# Patient Record
Sex: Male | Born: 1960 | Race: White | Hispanic: No | Marital: Married | Smoking: Current every day smoker
Health system: Southern US, Community
[De-identification: ages and names within clinical notes are randomized; demographics above are authoritative.]

## PROBLEM LIST (undated history)

## (undated) DIAGNOSIS — R0600 Dyspnea, unspecified: Secondary | ICD-10-CM

## (undated) DIAGNOSIS — E785 Hyperlipidemia, unspecified: Secondary | ICD-10-CM

## (undated) DIAGNOSIS — C61 Malignant neoplasm of prostate: Secondary | ICD-10-CM

## (undated) DIAGNOSIS — J410 Simple chronic bronchitis: Secondary | ICD-10-CM

## (undated) DIAGNOSIS — K219 Gastro-esophageal reflux disease without esophagitis: Secondary | ICD-10-CM

## (undated) DIAGNOSIS — I10 Essential (primary) hypertension: Secondary | ICD-10-CM

## (undated) DIAGNOSIS — R0609 Other forms of dyspnea: Secondary | ICD-10-CM

## (undated) DIAGNOSIS — K08109 Complete loss of teeth, unspecified cause, unspecified class: Secondary | ICD-10-CM

## (undated) DIAGNOSIS — Z973 Presence of spectacles and contact lenses: Secondary | ICD-10-CM

## (undated) DIAGNOSIS — N529 Male erectile dysfunction, unspecified: Secondary | ICD-10-CM

## (undated) DIAGNOSIS — Z972 Presence of dental prosthetic device (complete) (partial): Secondary | ICD-10-CM

## (undated) DIAGNOSIS — Z87828 Personal history of other (healed) physical injury and trauma: Secondary | ICD-10-CM

## (undated) HISTORY — PX: PROSTATE BIOPSY: SHX241

## (undated) HISTORY — PX: COLONOSCOPY: SHX174

## (undated) HISTORY — PX: TONSILLECTOMY: SUR1361

## (undated) HISTORY — PX: OTHER SURGICAL HISTORY: SHX169

---

## 2016-02-13 DIAGNOSIS — N401 Enlarged prostate with lower urinary tract symptoms: Secondary | ICD-10-CM | POA: Diagnosis not present

## 2016-02-13 DIAGNOSIS — R972 Elevated prostate specific antigen [PSA]: Secondary | ICD-10-CM | POA: Diagnosis not present

## 2016-03-18 DIAGNOSIS — R972 Elevated prostate specific antigen [PSA]: Secondary | ICD-10-CM | POA: Diagnosis not present

## 2016-03-18 DIAGNOSIS — N401 Enlarged prostate with lower urinary tract symptoms: Secondary | ICD-10-CM | POA: Diagnosis not present

## 2016-04-01 DIAGNOSIS — R972 Elevated prostate specific antigen [PSA]: Secondary | ICD-10-CM | POA: Diagnosis not present

## 2016-04-01 DIAGNOSIS — C61 Malignant neoplasm of prostate: Secondary | ICD-10-CM | POA: Diagnosis not present

## 2016-04-01 DIAGNOSIS — N410 Acute prostatitis: Secondary | ICD-10-CM | POA: Diagnosis not present

## 2016-04-01 DIAGNOSIS — N401 Enlarged prostate with lower urinary tract symptoms: Secondary | ICD-10-CM | POA: Diagnosis not present

## 2016-04-08 DIAGNOSIS — C61 Malignant neoplasm of prostate: Secondary | ICD-10-CM | POA: Diagnosis not present

## 2016-04-08 DIAGNOSIS — N390 Urinary tract infection, site not specified: Secondary | ICD-10-CM | POA: Diagnosis not present

## 2016-04-13 DIAGNOSIS — C61 Malignant neoplasm of prostate: Secondary | ICD-10-CM | POA: Diagnosis not present

## 2016-04-24 DIAGNOSIS — C61 Malignant neoplasm of prostate: Secondary | ICD-10-CM | POA: Diagnosis not present

## 2016-04-28 ENCOUNTER — Encounter: Payer: Self-pay | Admitting: Radiation Oncology

## 2016-05-18 ENCOUNTER — Ambulatory Visit
Admission: RE | Admit: 2016-05-18 | Discharge: 2016-05-18 | Disposition: A | Discharge status: Home / Self Care | Payer: 59 | Source: Ambulatory Visit | Attending: Radiation Oncology | Admitting: Radiation Oncology

## 2016-05-18 ENCOUNTER — Encounter: Payer: Self-pay | Admitting: Medical Oncology

## 2016-05-18 ENCOUNTER — Encounter: Payer: Self-pay | Admitting: Radiation Oncology

## 2016-05-18 VITALS — BP 142/95 | HR 117 | Temp 98.1°F | Resp 20 | Wt 227.2 lb

## 2016-05-18 DIAGNOSIS — Z79899 Other long term (current) drug therapy: Secondary | ICD-10-CM | POA: Diagnosis not present

## 2016-05-18 DIAGNOSIS — C61 Malignant neoplasm of prostate: Secondary | ICD-10-CM | POA: Insufficient documentation

## 2016-05-18 DIAGNOSIS — R972 Elevated prostate specific antigen [PSA]: Secondary | ICD-10-CM | POA: Diagnosis not present

## 2016-05-18 HISTORY — DX: Gastro-esophageal reflux disease without esophagitis: K21.9

## 2016-05-18 HISTORY — DX: Essential (primary) hypertension: I10

## 2016-05-18 HISTORY — DX: Male erectile dysfunction, unspecified: N52.9

## 2016-05-18 HISTORY — DX: Malignant neoplasm of prostate: C61

## 2016-05-18 NOTE — Progress Notes (Signed)
GU Location of Tumor / Histology: prostatic adenocarcinoma  If Prostate Cancer, Gleason Score is (3 + 4) and PSA is (5.8). Prostate volume: 37.6 mm X 46.6 mm X 31.1 mm  Dean Burton presented to Dr. Nila Nephew 02/13/2016 with an elevated PSA.  Biopsies of prostate (if applicable) revealed:     Past/Anticipated interventions by urology, if any: biopsy, referral to Dr. Tammi Klippel to discuss seeds  Past/Anticipated interventions by medical oncology, if any: no  Weight changes, if any: no  Bowel/Bladder complaints, if any:  IPSS 0. Denies dysuria, hematuria, or leakage. Reports taking flomax and finasteride. Finasteride was recently prescribed by Dr. Nila Nephew and patient believes its causing his nausea.  Nausea/Vomiting, if any: no  Pain issues, if any: no  SAFETY ISSUES:  Prior radiation? no  Pacemaker/ICD? no  Possible current pregnancy? no  Is the patient on methotrexate? no  Current Complaints / other details:  56 year old male. Interested in seed implant.

## 2016-05-18 NOTE — Progress Notes (Signed)
See progress note under physician encounter. 

## 2016-05-18 NOTE — Progress Notes (Signed)
I met Mr. Ouzts and his wife today during his consult with Dr. Tammi Klippel.I introduced myself, discussed my role and gave them my business card.  He has done research and would like to have brachytherapy as treatment. I will continue to follow and asked them to call me with questions or concerns. They voiced understanding.

## 2016-05-18 NOTE — Progress Notes (Signed)
Radiation Oncology         [phone] ________________________________  Initial outpatient Consultation  Name: Konrad Hoak MRN: [id number]  Date: 05/18/2016  DOB: [birth year]  BH:ALPFXT,KWIO, MD  Joie Bimler, MD   REFERRING PHYSICIAN: Joie Bimler, MD  DIAGNOSIS: 56 year-old gentleman with Stage T1c adenocarcinoma of the prostate with Gleason Score of 3+4, and PSA of 5.8.    ICD-9-CM ICD-10-CM   1. Malignant neoplasm of prostate ([place]) 185 C61     HISTORY OF PRESENT ILLNESS: Gladstone Rosas is a 56 y.o. male with a diagnosis of prostate cancer. He was noted to have an elevated PSA of 4.8 in November 2017 by his primary care physician.  Repeat PSA on 03/18/16 is 5.8. Accordingly, he was referred for evaluation in urology by Dr. Nila Nephew on 02/13/16,  digital rectal examination was performed at that time revealing no palpable nodularity. The patient proceeded to transrectal ultrasound with 12 biopsies of the prostate on 04/01/2016.  The prostate volume measured 37.32mm x 46.74mm x  31.32mm.  Out of 12 core biopsies, 6 were positive.  The maximum Gleason score was 3+4, and this was seen in the RA, LLB, and LB. Additionally Gleason score 3+3 was seen in the RLM, LLM, and LM.      The patient reviewed the biopsy results with his urologist and he has kindly been referred today for discussion of potential radiation treatment options.   PREVIOUS RADIATION THERAPY: No  PAST MEDICAL HISTORY:  Past Medical History:  Diagnosis Date  . Elevated PSA   . Erectile dysfunction   . GERD (gastroesophageal reflux disease)   . Hypertension   . Prostate cancer ([place])       PAST SURGICAL HISTORY: Past Surgical History:  Procedure Laterality Date  . COLONOSCOPY    . COLONOSCOPY WITH ESOPHAGOGASTRODUODENOSCOPY (EGD)    . PROSTATE BIOPSY      FAMILY HISTORY:  Family History  Problem Relation Age of Onset  . Cancer Neg Hx     SOCIAL HISTORY:  Social History   Social History  . Marital status:  Married    Spouse name: N/A  . Number of children: N/A  . Years of education: N/A   Occupational History  . Not on file.   Social History Main Topics  . Smoking status: Current Every Day Smoker    Packs/day: 1.00    Years: 35.00  . Smokeless tobacco: Never Used  . Alcohol use Yes     Comment: occasional  . Drug use: No  . Sexual activity: Yes   Other Topics Concern  . Not on file   Social History Narrative  . No narrative on file    ALLERGIES: Ciprofloxacin  MEDICATIONS:  Current Outpatient Prescriptions  Medication Sig Dispense Refill  . amLODipine (NORVASC) 2.5 MG tablet Take 2.5 mg by mouth daily.  1  . BEPREVE 1.5 % SOLN INSTILL 1 DROP 1-2 TIMES DAILY AS NEEDED FOR ITCHY WATERY EYES  1  . citalopram (CELEXA) 20 MG tablet Take 20 mg by mouth daily.  1  . finasteride (PROSCAR) 5 MG tablet Take 5 mg by mouth daily.  3  . fluticasone (FLONASE) 50 MCG/ACT nasal spray USE 2 SPRAYS PER NOSTRIL DAILY  3  . losartan-hydrochlorothiazide (HYZAAR) 100-25 MG tablet 1 (ONE) TABLET BY MOUTH DAILY  3  . omeprazole (PRILOSEC) 40 MG capsule Take 40 mg by mouth daily.  10  . rosuvastatin (CRESTOR) 5 MG tablet Take 5 mg by mouth at bedtime.  2  .  tamsulosin (FLOMAX) 0.4 MG CAPS capsule TAKE ONE CAPSULE BY MOUTH EVERY DAY 1/2 HOUR AFTER SUPPER  3   No current facility-administered medications for this encounter.     REVIEW OF SYSTEMS:  On review of systems, the patient reports that he is doing well overall. He denies any chest pain, shortness of breath, cough, fevers, chills, night sweats, unintended weight changes. He denies any bowel disturbances, and denies abdominal pain. He denies any new musculoskeletal or joint aches or pains. His IPSS was 0, indicating no urinary symptoms. He denies dysuria, hematuria, or leakage. He reports taking Flomax and finasteride. Finasteride was recently prescribed by Dr. Nila Nephew and patient believes it's causing his nausea and vomiting. He is able to complete  sexual activity with about half the time attempts. A complete review of systems is obtained and is otherwise negative.    PHYSICAL EXAM:  Wt Readings from Last 3 Encounters:  05/18/16 227 lb 3.2 oz (103.1 kg)   Temp Readings from Last 3 Encounters:  05/18/16 98.1 F (36.7 C) (Oral)   BP Readings from Last 3 Encounters:  05/18/16 (!) 142/95   Pulse Readings from Last 3 Encounters:  05/18/16 (!) 117   Pain Assessment Pain Score: 0-No pain/10  In general this is a well appearing caucasian male in no acute distress. He is alert and oriented x4 and appropriate throughout the examination. HEENT reveals that the patient is normocephalic, atraumatic. EOMs are intact. PERRLA. Skin is intact without any evidence of gross lesions. Cardiovascular exam reveals a regular rate and rhythm, no clicks rubs or murmurs are auscultated. Chest is clear to auscultation bilaterally. Lymphatic assessment is performed and does not reveal any adenopathy in the cervical, supraclavicular, axillary, or inguinal chains. Abdomen has active bowel sounds in all quadrants and is intact. The abdomen is soft, non tender, non distended. Lower extremities are negative for pretibial pitting edema, deep calf tenderness, cyanosis or clubbing.   KPS = 100  100 - Normal; no complaints; no evidence of disease. 90   - Able to carry on normal activity; minor signs or symptoms of disease. 80   - Normal activity with effort; some signs or symptoms of disease. 51   - Cares for self; unable to carry on normal activity or to do active work. 60   - Requires occasional assistance, but is able to care for most of his personal needs. 50   - Requires considerable assistance and frequent medical care. 51   - Disabled; requires special care and assistance. 65   - Severely disabled; hospital admission is indicated although death not imminent. 76   - Very sick; hospital admission necessary; active supportive treatment necessary. 10   -  Moribund; fatal processes progressing rapidly. 0     - Dead  Karnofsky DA, Abelmann WH, Craver LS and Burchenal JH [phone]) The use of the nitrogen mustards in the palliative treatment of carcinoma: with particular reference to bronchogenic carcinoma Cancer 1 [phone]  LABORATORY DATA:  No results found for: WBC, HGB, HCT, MCV, PLT No results found for: NA, K, CL, CO2 No results found for: ALT, AST, GGT, ALKPHOS, BILITOT   RADIOGRAPHY: No results found.    IMPRESSION/PLAN: 1. 56 y.o. gentleman with Stage T1c adenocarcinoma of the prostate with Gleason Score of 3+4, and PSA of 5.8.  Today I reviewed the findings and workup thus far.  We discussed the natural history of prostate cancer.  We reviewed the the implications of T-stage, Gleason's Score, and PSA on  decision-making and outcomes in prostate cancer.  We discussed radiation treatment in the management of prostate cancer with regard to the logistics and delivery of external beam radiation treatment as well as the logistics and delivery of prostate brachytherapy.  We compared and contrasted each of these approaches and also compared these against prostatectomy.  The patient expressed interest in prostate brachytherapy.  I filled out a patient counseling form for him with relevant treatment diagrams and we retained a copy for our records.   The patient would like to proceed with prostate brachytherapy.  I will share my findings with Dr. Nila Nephew and move forward with scheduling the procedure in the near future.     The patient would like to proceed with prostate brachytherapy.  I will share my findings with Dr. Nila Nephew and move forward with scheduling the procedure in the near future.     I recommended the patient discontinue taking finasteride to see if nausea and vomiting resolve. The patient will contact Dr. Ara Kussmaul office and continue to follow with urology regarding urinary symptoms and medications.   I enjoyed meeting with him today, and will look  forward to participating in the care of this very nice gentleman.   ------------------------------------------------   Tyler Pita, MD [place] Director and Director of Stereotactic Radiosurgery Direct Dial: [phone]  Fax: [phone] [email or web address]  Skype  [email or web address]  This document serves as a record of services personally performed by Tyler Pita, MD. It was created on his behalf by Arlyce Harman, a trained medical scribe. The creation of this record is based on the scribe's personal observations and the provider's statements to them. This document has been checked and approved by the attending provider.

## 2016-05-22 ENCOUNTER — Telehealth: Payer: Self-pay | Admitting: *Deleted

## 2016-05-22 NOTE — Telephone Encounter (Signed)
CALLED PATIENT TO INFORM OF PRE-SEED PLANNING CT ON 05-29-16 @ 3 PM, SPOKE WITH PATIENT'S WIFE - MARY AND SHE IS AWARE OF THIS APPT.

## 2016-05-26 ENCOUNTER — Telehealth: Payer: Self-pay | Admitting: *Deleted

## 2016-05-26 NOTE — Telephone Encounter (Signed)
Called patient to inform of pre-seed appt. On 05-29-16 and his implant on 08-20-16, spoke with patient and he is aware of these appts.

## 2016-05-28 ENCOUNTER — Telehealth: Payer: Self-pay | Admitting: *Deleted

## 2016-05-28 NOTE — Telephone Encounter (Signed)
CALLED PATIENT TO REMIND OF PRE-SEED APPTS. FOR 05-29-16, SPOKE WITH PATIENT AND HE IS AWARE OF  THESE APPTS.

## 2016-05-29 ENCOUNTER — Inpatient Hospital Stay
Admission: RE | Admit: 2016-05-29 | Discharge: 2016-05-29 | Disposition: A | Discharge status: Home / Self Care | Payer: 59 | Source: Ambulatory Visit | Attending: Radiation Oncology | Admitting: Radiation Oncology

## 2016-05-29 ENCOUNTER — Ambulatory Visit (HOSPITAL_COMMUNITY)
Admission: RE | Admit: 2016-05-29 | Discharge: 2016-05-29 | Disposition: A | Discharge status: Home / Self Care | Payer: 59 | Source: Ambulatory Visit | Attending: Urology | Admitting: Urology

## 2016-05-29 ENCOUNTER — Other Ambulatory Visit: Payer: Self-pay

## 2016-05-29 ENCOUNTER — Ambulatory Visit: Payer: 59 | Admitting: Radiation Oncology

## 2016-05-29 ENCOUNTER — Ambulatory Visit
Admission: RE | Admit: 2016-05-29 | Discharge: 2016-05-29 | Disposition: A | Discharge status: Home / Self Care | Payer: 59 | Source: Ambulatory Visit | Attending: Radiation Oncology | Admitting: Radiation Oncology

## 2016-05-29 DIAGNOSIS — C61 Malignant neoplasm of prostate: Secondary | ICD-10-CM

## 2016-05-29 DIAGNOSIS — Z01810 Encounter for preprocedural cardiovascular examination: Secondary | ICD-10-CM | POA: Diagnosis not present

## 2016-05-29 DIAGNOSIS — J984 Other disorders of lung: Secondary | ICD-10-CM | POA: Insufficient documentation

## 2016-05-29 DIAGNOSIS — R918 Other nonspecific abnormal finding of lung field: Secondary | ICD-10-CM | POA: Diagnosis not present

## 2016-05-29 NOTE — Progress Notes (Signed)
  Radiation Oncology         [phone]) [phone] ________________________________  Name: Dean Burton MRN: [id number]  Date: 05/29/2016  DOB: 1961/01/17  SIMULATION AND TREATMENT PLANNING NOTE PUBIC ARCH STUDY  FP:OIPPGF,QMKJ, MD  Joie Bimler, MD  DIAGNOSIS: 56 year-old gentleman with Stage T1c adenocarcinoma of the prostate with Gleason Score of 3+4, and PSA of 5.8.     ICD-9-CM ICD-10-CM   1. Malignant neoplasm of prostate ([place]) [place]:  The patient presented today for evaluation for possible prostate seed implant. He was brought to the radiation planning suite and placed supine on the CT couch. A 3-dimensional image study set was obtained in upload to the planning computer. There, on each axial slice, I contoured the prostate gland. Then, using three-dimensional radiation planning tools I reconstructed the prostate in view of the structures from the transperineal needle pathway to assess for possible pubic arch interference. In doing so, I did not appreciate any pubic arch interference. Also, the patient's prostate volume was estimated based on the drawn structure. The volume was 29 cc.  Given the pubic arch appearance and prostate volume, patient remains a good candidate to proceed with prostate seed implant. Today, he freely provided informed written consent to proceed.    PLAN: The patient will undergo prostate seed implant.   ________________________________  Sheral Apley. Tammi Klippel, M.D.  This document serves as a record of services personally performed by Tyler Pita, MD. It was created on his behalf by Arlyce Harman, a trained medical scribe. The creation of this record is based on the scribe's personal observations and the provider's statements to them. This document has been checked and approved by the attending provider.

## 2016-07-07 DIAGNOSIS — I10 Essential (primary) hypertension: Secondary | ICD-10-CM | POA: Diagnosis not present

## 2016-07-07 DIAGNOSIS — R7303 Prediabetes: Secondary | ICD-10-CM | POA: Diagnosis not present

## 2016-07-07 DIAGNOSIS — E782 Mixed hyperlipidemia: Secondary | ICD-10-CM | POA: Diagnosis not present

## 2016-07-11 DIAGNOSIS — G8911 Acute pain due to trauma: Secondary | ICD-10-CM | POA: Diagnosis not present

## 2016-07-11 DIAGNOSIS — S82101A Unspecified fracture of upper end of right tibia, initial encounter for closed fracture: Secondary | ICD-10-CM | POA: Diagnosis not present

## 2016-07-11 DIAGNOSIS — S99911A Unspecified injury of right ankle, initial encounter: Secondary | ICD-10-CM | POA: Diagnosis not present

## 2016-07-11 DIAGNOSIS — M79661 Pain in right lower leg: Secondary | ICD-10-CM | POA: Diagnosis not present

## 2016-07-11 DIAGNOSIS — M25561 Pain in right knee: Secondary | ICD-10-CM | POA: Diagnosis not present

## 2016-07-11 DIAGNOSIS — S82201A Unspecified fracture of shaft of right tibia, initial encounter for closed fracture: Secondary | ICD-10-CM | POA: Diagnosis not present

## 2016-07-11 DIAGNOSIS — Z23 Encounter for immunization: Secondary | ICD-10-CM | POA: Diagnosis not present

## 2016-07-11 DIAGNOSIS — S8990XA Unspecified injury of unspecified lower leg, initial encounter: Secondary | ICD-10-CM | POA: Diagnosis not present

## 2016-07-11 DIAGNOSIS — M25571 Pain in right ankle and joints of right foot: Secondary | ICD-10-CM | POA: Diagnosis not present

## 2016-07-11 DIAGNOSIS — S82231A Displaced oblique fracture of shaft of right tibia, initial encounter for closed fracture: Secondary | ICD-10-CM | POA: Diagnosis not present

## 2016-07-14 DIAGNOSIS — S82131A Displaced fracture of medial condyle of right tibia, initial encounter for closed fracture: Secondary | ICD-10-CM | POA: Diagnosis not present

## 2016-07-16 DIAGNOSIS — Z79899 Other long term (current) drug therapy: Secondary | ICD-10-CM | POA: Diagnosis not present

## 2016-07-16 DIAGNOSIS — S82141A Displaced bicondylar fracture of right tibia, initial encounter for closed fracture: Secondary | ICD-10-CM | POA: Diagnosis not present

## 2016-07-16 DIAGNOSIS — Z01818 Encounter for other preprocedural examination: Secondary | ICD-10-CM | POA: Diagnosis not present

## 2016-07-16 DIAGNOSIS — S82131A Displaced fracture of medial condyle of right tibia, initial encounter for closed fracture: Secondary | ICD-10-CM | POA: Diagnosis not present

## 2016-07-16 DIAGNOSIS — Z881 Allergy status to other antibiotic agents status: Secondary | ICD-10-CM | POA: Diagnosis not present

## 2016-07-19 DIAGNOSIS — N401 Enlarged prostate with lower urinary tract symptoms: Secondary | ICD-10-CM | POA: Diagnosis not present

## 2016-07-19 DIAGNOSIS — Z4789 Encounter for other orthopedic aftercare: Secondary | ICD-10-CM | POA: Diagnosis not present

## 2016-07-30 DIAGNOSIS — M810 Age-related osteoporosis without current pathological fracture: Secondary | ICD-10-CM | POA: Diagnosis not present

## 2016-07-30 DIAGNOSIS — C61 Malignant neoplasm of prostate: Secondary | ICD-10-CM | POA: Diagnosis not present

## 2016-08-07 DIAGNOSIS — M79604 Pain in right leg: Secondary | ICD-10-CM | POA: Diagnosis not present

## 2016-08-07 DIAGNOSIS — M25561 Pain in right knee: Secondary | ICD-10-CM | POA: Diagnosis not present

## 2016-08-07 DIAGNOSIS — R2689 Other abnormalities of gait and mobility: Secondary | ICD-10-CM | POA: Diagnosis not present

## 2016-08-12 DIAGNOSIS — R2689 Other abnormalities of gait and mobility: Secondary | ICD-10-CM | POA: Diagnosis not present

## 2016-08-12 DIAGNOSIS — M25561 Pain in right knee: Secondary | ICD-10-CM | POA: Diagnosis not present

## 2016-08-12 DIAGNOSIS — M79604 Pain in right leg: Secondary | ICD-10-CM | POA: Diagnosis not present

## 2016-08-14 ENCOUNTER — Telehealth: Payer: Self-pay | Admitting: *Deleted

## 2016-08-14 NOTE — Telephone Encounter (Signed)
Called patient to inform implant has been rescheduled for 10-29-16, spoke with patient and he is aware of this change.

## 2016-08-19 DIAGNOSIS — M79604 Pain in right leg: Secondary | ICD-10-CM | POA: Diagnosis not present

## 2016-08-19 DIAGNOSIS — R2689 Other abnormalities of gait and mobility: Secondary | ICD-10-CM | POA: Diagnosis not present

## 2016-08-19 DIAGNOSIS — M25561 Pain in right knee: Secondary | ICD-10-CM | POA: Diagnosis not present

## 2016-08-20 ENCOUNTER — Ambulatory Visit (HOSPITAL_BASED_OUTPATIENT_CLINIC_OR_DEPARTMENT_OTHER): Admission: RE | Admit: 2016-08-20 | Payer: 59 | Source: Ambulatory Visit | Admitting: Urology

## 2016-08-20 SURGERY — INSERTION, RADIATION SOURCE, PROSTATE
Anesthesia: General

## 2016-08-21 DIAGNOSIS — R2689 Other abnormalities of gait and mobility: Secondary | ICD-10-CM | POA: Diagnosis not present

## 2016-08-21 DIAGNOSIS — M25561 Pain in right knee: Secondary | ICD-10-CM | POA: Diagnosis not present

## 2016-08-21 DIAGNOSIS — M79604 Pain in right leg: Secondary | ICD-10-CM | POA: Diagnosis not present

## 2016-08-26 DIAGNOSIS — R2689 Other abnormalities of gait and mobility: Secondary | ICD-10-CM | POA: Diagnosis not present

## 2016-08-26 DIAGNOSIS — M25561 Pain in right knee: Secondary | ICD-10-CM | POA: Diagnosis not present

## 2016-08-26 DIAGNOSIS — M79604 Pain in right leg: Secondary | ICD-10-CM | POA: Diagnosis not present

## 2016-09-01 DIAGNOSIS — M25561 Pain in right knee: Secondary | ICD-10-CM | POA: Diagnosis not present

## 2016-09-01 DIAGNOSIS — M79604 Pain in right leg: Secondary | ICD-10-CM | POA: Diagnosis not present

## 2016-09-01 DIAGNOSIS — R2689 Other abnormalities of gait and mobility: Secondary | ICD-10-CM | POA: Diagnosis not present

## 2016-09-07 DIAGNOSIS — H6123 Impacted cerumen, bilateral: Secondary | ICD-10-CM | POA: Diagnosis not present

## 2016-09-07 DIAGNOSIS — J0110 Acute frontal sinusitis, unspecified: Secondary | ICD-10-CM | POA: Diagnosis not present

## 2016-09-11 DIAGNOSIS — S82131D Displaced fracture of medial condyle of right tibia, subsequent encounter for closed fracture with routine healing: Secondary | ICD-10-CM | POA: Diagnosis not present

## 2016-09-11 DIAGNOSIS — M25561 Pain in right knee: Secondary | ICD-10-CM | POA: Diagnosis not present

## 2016-09-11 DIAGNOSIS — R2689 Other abnormalities of gait and mobility: Secondary | ICD-10-CM | POA: Diagnosis not present

## 2016-09-11 DIAGNOSIS — M79604 Pain in right leg: Secondary | ICD-10-CM | POA: Diagnosis not present

## 2016-09-16 DIAGNOSIS — M25561 Pain in right knee: Secondary | ICD-10-CM | POA: Diagnosis not present

## 2016-09-16 DIAGNOSIS — R2689 Other abnormalities of gait and mobility: Secondary | ICD-10-CM | POA: Diagnosis not present

## 2016-09-16 DIAGNOSIS — M79604 Pain in right leg: Secondary | ICD-10-CM | POA: Diagnosis not present

## 2016-09-23 DIAGNOSIS — R2689 Other abnormalities of gait and mobility: Secondary | ICD-10-CM | POA: Diagnosis not present

## 2016-09-23 DIAGNOSIS — M25561 Pain in right knee: Secondary | ICD-10-CM | POA: Diagnosis not present

## 2016-09-23 DIAGNOSIS — M79604 Pain in right leg: Secondary | ICD-10-CM | POA: Diagnosis not present

## 2016-09-30 DIAGNOSIS — M25561 Pain in right knee: Secondary | ICD-10-CM | POA: Diagnosis not present

## 2016-09-30 DIAGNOSIS — R2689 Other abnormalities of gait and mobility: Secondary | ICD-10-CM | POA: Diagnosis not present

## 2016-09-30 DIAGNOSIS — M79604 Pain in right leg: Secondary | ICD-10-CM | POA: Diagnosis not present

## 2016-10-08 DIAGNOSIS — R2689 Other abnormalities of gait and mobility: Secondary | ICD-10-CM | POA: Diagnosis not present

## 2016-10-08 DIAGNOSIS — M79604 Pain in right leg: Secondary | ICD-10-CM | POA: Diagnosis not present

## 2016-10-08 DIAGNOSIS — M25561 Pain in right knee: Secondary | ICD-10-CM | POA: Diagnosis not present

## 2016-10-09 DIAGNOSIS — S82131D Displaced fracture of medial condyle of right tibia, subsequent encounter for closed fracture with routine healing: Secondary | ICD-10-CM | POA: Diagnosis not present

## 2016-10-14 DIAGNOSIS — R2689 Other abnormalities of gait and mobility: Secondary | ICD-10-CM | POA: Diagnosis not present

## 2016-10-14 DIAGNOSIS — M79604 Pain in right leg: Secondary | ICD-10-CM | POA: Diagnosis not present

## 2016-10-14 DIAGNOSIS — M25561 Pain in right knee: Secondary | ICD-10-CM | POA: Diagnosis not present

## 2016-10-22 DIAGNOSIS — R000 Tachycardia, unspecified: Secondary | ICD-10-CM | POA: Diagnosis not present

## 2016-10-22 DIAGNOSIS — C61 Malignant neoplasm of prostate: Secondary | ICD-10-CM | POA: Diagnosis not present

## 2016-10-22 DIAGNOSIS — R509 Fever, unspecified: Secondary | ICD-10-CM | POA: Diagnosis not present

## 2016-10-22 DIAGNOSIS — Z7901 Long term (current) use of anticoagulants: Secondary | ICD-10-CM | POA: Diagnosis not present

## 2016-10-22 DIAGNOSIS — Z01812 Encounter for preprocedural laboratory examination: Secondary | ICD-10-CM | POA: Diagnosis not present

## 2016-10-22 DIAGNOSIS — E119 Type 2 diabetes mellitus without complications: Secondary | ICD-10-CM | POA: Diagnosis not present

## 2016-10-22 DIAGNOSIS — R918 Other nonspecific abnormal finding of lung field: Secondary | ICD-10-CM | POA: Diagnosis not present

## 2016-10-23 ENCOUNTER — Telehealth: Payer: Self-pay | Admitting: Radiation Oncology

## 2016-10-23 ENCOUNTER — Telehealth: Payer: Self-pay | Admitting: *Deleted

## 2016-10-23 DIAGNOSIS — R002 Palpitations: Secondary | ICD-10-CM | POA: Diagnosis not present

## 2016-10-23 DIAGNOSIS — R531 Weakness: Secondary | ICD-10-CM | POA: Diagnosis not present

## 2016-10-23 DIAGNOSIS — R0602 Shortness of breath: Secondary | ICD-10-CM | POA: Diagnosis not present

## 2016-10-23 NOTE — Telephone Encounter (Signed)
On 10-23-16 Debra from Dr. Nila Nephew office said it was ok for the patient to have seed implant for next Thursday.

## 2016-10-23 NOTE — Telephone Encounter (Signed)
Dean Burton, from Dr. Ara Kussmaul office called saying that we may need to cancel Dean Burton implant for next Thurs. Due to a possible PE. She said that they are sending him to the ED for a CT Angio and will make a decision from there and give [place] a call possibly on Monday, 9/24.

## 2016-10-26 DIAGNOSIS — C61 Malignant neoplasm of prostate: Secondary | ICD-10-CM | POA: Diagnosis not present

## 2016-10-27 ENCOUNTER — Encounter (HOSPITAL_BASED_OUTPATIENT_CLINIC_OR_DEPARTMENT_OTHER): Payer: Self-pay | Admitting: *Deleted

## 2016-10-27 NOTE — Progress Notes (Addendum)
NPO AFTER MN W/ CLEAR LIQUIDS UNTIL 0800 (NO CREAM/ MILK PRODUCTS).  ARRIVE AT 1230. CURRENT EKG AND CXR IN CHART AND EPIC.  PER PT LAB WORK DONE THRU DR CHAO OFFICE, WILL CALL OFFICE AND HAVE THEM FAX RESULTS  FOR CHART.  WILL DO FLEET ENEMA AM DOS.    ADDENDUM:  CALLED AND SPOKE W/ DEBBORAH, OR SCHEDULER FOR DR [facility] VIA PHONE.  REQUESTED LAB RESULTS TO BE FAXED.  ALSO, ASKED IF DR CHAO IN OFFICE BECAUSE PT HAS ALLERGY TO CIPRO.  DR CHAO ON SPEAKER PHONE AND HEARD ME ASK , PER DR CHAO ORDER GENTAMYOCIN 100 MG PER IV ON CALL TO OR AND D/C CIPRO.  REPEATED BACK ORDER AND DR CHAO STATED CORRECT.

## 2016-10-27 NOTE — H&P (Signed)
NAMEBELVIN, Dean Burton NO.:  [id number]  MEDICAL RECORD NO.:  [id number]  LOCATION:                                 FACILITY:  PHYSICIAN:  Joie Bimler, M.D.          DATE OF BIRTH:  DATE OF ADMISSION: DATE OF DISCHARGE:                             HISTORY & PHYSICAL   This is planned procedure October 29, 2016.  HISTORY OF PRESENT ILLNESS:  Dean Burton is a 56 year old white male, with a diagnosis of prostate cancer.  He had an elevated PSA to 5.8, and underwent prostate biopsy in February 2018.  He was found to have a Gleason 6 and 7 on the right and left sides.  He had 4 cores positive on the left and 2 on the right.  Prostate volume was approximately 37.6, approximately 40 g.  He underwent staging with CT scan, which did not show any evidence of nodal disease.  He has reviewed his options and has opted for radioactive seed implantation.  His past history is significant for gastroesophageal reflux, erectile dysfunction, and hypertension.  Past surgeries include colonoscopy and prostate biopsy. He does not have a family history of prostate cancer.  He does smoke. He smoked a pack a day for about the last 35 years.  He is allergic to Cipro.  MEDICATIONS:  Listed in previous consult notes, but include, 1. Norvasc 2.5 mg daily. 2. Celexa 20 mg daily. 3. Finasteride 5 mg daily. 4. Losartan/hydrochlorothiazide 1 daily. 5. Prilosec 40 mg daily. 6. Crestor 5 mg daily. 7. Flomax 0.4 mg daily.  REVIEW OF SYSTEMS:  Negative for chest pain, shortness of breath, coughing.  He states he has regular bowel movements.  No blood per rectum.  He denies lower extremity weakness.  PHYSICAL EXAMINATION:  GENERAL:  Well developed, obese man, in no acute distress. VITAL SIGNS:  Pending for today. HEENT:  The patient is normocephalic and atraumatic.  Extraocular muscles are intact. SKIN:  Intact without any evidence of gross lesions. CARDIOVASCULAR:  Exam reveals regular  rate with tachycardia.  No murmurs. CHEST:  Clear to auscultation bilaterally.  No adenopathy palpable in the cervical or supraclavicular area. ABDOMEN:  Obese and nontender.  Mild distention. EXTREMITIES:  The lower extremities are without edema nor calf tenderness.  Range of motion is intact. NEUROLOGICAL:  Nonfocal.  Pertinent laboratories are pending.  He did have a recent workup for the tachycardia with chest CT and EKG and this was all normal.  IMPRESSION:  A 56 year old man with stage T1c adenocarcinoma of the prostate.  He has reviewed his options and is admitted for his definitive therapy.  PLAN:  We will proceed with transrectal ultrasound and radioactive seed implantation for definitive therapy of the prostate cancer.  The patient has been counseled about the therapy and his questions answered.  He wishes to proceed with this.    ______________________________ Joie Bimler, M.D.   ______________________________ Joie Bimler, M.D.    RC/MEDQ  D:  10/27/2016  T:  10/27/2016  Job:  [id number]

## 2016-10-28 ENCOUNTER — Telehealth: Payer: Self-pay | Admitting: *Deleted

## 2016-10-28 MED ORDER — GENTAMICIN SULFATE 40 MG/ML IJ SOLN
430.0000 mg | Freq: Once | INTRAVENOUS | Status: AC
  Administered 2016-10-29: 200 mg via INTRAVENOUS
  Filled 2016-10-28 (×2): qty 10.75

## 2016-10-28 NOTE — Telephone Encounter (Signed)
CALLED PATIENT TO REMIND OF IMPLANT FOR 10-29-16, SPOKE WITH PATIENT'S WIFE- MARY AND SHE IS AWARE OF THIS IMPLANT.

## 2016-10-29 ENCOUNTER — Ambulatory Visit (HOSPITAL_BASED_OUTPATIENT_CLINIC_OR_DEPARTMENT_OTHER)
Admission: RE | Admit: 2016-10-29 | Discharge: 2016-10-29 | Disposition: A | Discharge status: Home / Self Care | Payer: 59 | Source: Ambulatory Visit | Attending: Urology | Admitting: Urology

## 2016-10-29 ENCOUNTER — Ambulatory Visit (HOSPITAL_COMMUNITY): Payer: 59

## 2016-10-29 ENCOUNTER — Encounter (HOSPITAL_BASED_OUTPATIENT_CLINIC_OR_DEPARTMENT_OTHER): Payer: Self-pay | Admitting: *Deleted

## 2016-10-29 ENCOUNTER — Ambulatory Visit (HOSPITAL_BASED_OUTPATIENT_CLINIC_OR_DEPARTMENT_OTHER): Payer: 59 | Admitting: Anesthesiology

## 2016-10-29 ENCOUNTER — Encounter (HOSPITAL_BASED_OUTPATIENT_CLINIC_OR_DEPARTMENT_OTHER)
Admission: RE | Disposition: A | Discharge status: Home / Self Care | Payer: Self-pay | Source: Ambulatory Visit | Attending: Urology

## 2016-10-29 DIAGNOSIS — Z881 Allergy status to other antibiotic agents status: Secondary | ICD-10-CM | POA: Insufficient documentation

## 2016-10-29 DIAGNOSIS — E669 Obesity, unspecified: Secondary | ICD-10-CM | POA: Insufficient documentation

## 2016-10-29 DIAGNOSIS — I10 Essential (primary) hypertension: Secondary | ICD-10-CM | POA: Diagnosis not present

## 2016-10-29 DIAGNOSIS — C61 Malignant neoplasm of prostate: Secondary | ICD-10-CM | POA: Insufficient documentation

## 2016-10-29 DIAGNOSIS — K219 Gastro-esophageal reflux disease without esophagitis: Secondary | ICD-10-CM | POA: Insufficient documentation

## 2016-10-29 DIAGNOSIS — Z79899 Other long term (current) drug therapy: Secondary | ICD-10-CM | POA: Diagnosis not present

## 2016-10-29 DIAGNOSIS — F17210 Nicotine dependence, cigarettes, uncomplicated: Secondary | ICD-10-CM | POA: Diagnosis not present

## 2016-10-29 DIAGNOSIS — Z6833 Body mass index (BMI) 33.0-33.9, adult: Secondary | ICD-10-CM | POA: Insufficient documentation

## 2016-10-29 DIAGNOSIS — N529 Male erectile dysfunction, unspecified: Secondary | ICD-10-CM | POA: Diagnosis not present

## 2016-10-29 HISTORY — PX: RADIOACTIVE SEED IMPLANT: SHX5150

## 2016-10-29 HISTORY — DX: Presence of dental prosthetic device (complete) (partial): Z97.2

## 2016-10-29 HISTORY — DX: Dyspnea, unspecified: R06.00

## 2016-10-29 HISTORY — DX: Presence of spectacles and contact lenses: Z97.3

## 2016-10-29 HISTORY — DX: Other forms of dyspnea: R06.09

## 2016-10-29 HISTORY — PX: CYSTOSCOPY: SHX5120

## 2016-10-29 HISTORY — DX: Simple chronic bronchitis: J41.0

## 2016-10-29 HISTORY — DX: Hyperlipidemia, unspecified: E78.5

## 2016-10-29 HISTORY — DX: Personal history of other (healed) physical injury and trauma: Z87.828

## 2016-10-29 HISTORY — DX: Complete loss of teeth, unspecified cause, unspecified class: K08.109

## 2016-10-29 SURGERY — INSERTION, RADIATION SOURCE, PROSTATE
Anesthesia: General

## 2016-10-29 MED ORDER — MIDAZOLAM HCL 2 MG/2ML IJ SOLN
INTRAMUSCULAR | Status: AC
  Filled 2016-10-29: qty 2

## 2016-10-29 MED ORDER — LACTATED RINGERS IV SOLN
INTRAVENOUS | Status: DC
  Filled 2016-10-29: qty 1000

## 2016-10-29 MED ORDER — METOCLOPRAMIDE HCL 5 MG/ML IJ SOLN
10.0000 mg | Freq: Once | INTRAMUSCULAR | Status: DC | PRN
  Filled 2016-10-29: qty 2

## 2016-10-29 MED ORDER — PROPOFOL 10 MG/ML IV BOLUS
INTRAVENOUS | Status: DC | PRN
  Administered 2016-10-29: 270 mg via INTRAVENOUS

## 2016-10-29 MED ORDER — BELLADONNA ALKALOIDS-OPIUM 16.2-60 MG RE SUPP
RECTAL | Status: DC | PRN
  Administered 2016-10-29: 1 via RECTAL

## 2016-10-29 MED ORDER — FENTANYL CITRATE (PF) 100 MCG/2ML IJ SOLN
INTRAMUSCULAR | Status: AC
  Filled 2016-10-29: qty 4

## 2016-10-29 MED ORDER — FENTANYL CITRATE (PF) 100 MCG/2ML IJ SOLN
INTRAMUSCULAR | Status: DC | PRN
  Administered 2016-10-29 (×2): 50 ug via INTRAVENOUS

## 2016-10-29 MED ORDER — CIPROFLOXACIN IN D5W 400 MG/200ML IV SOLN
400.0000 mg | INTRAVENOUS | Status: DC
  Filled 2016-10-29: qty 200

## 2016-10-29 MED ORDER — LACTATED RINGERS IV SOLN
INTRAVENOUS | Status: DC
  Administered 2016-10-29 (×2): via INTRAVENOUS
  Filled 2016-10-29: qty 1000

## 2016-10-29 MED ORDER — FLEET ENEMA 7-19 GM/118ML RE ENEM
1.0000 | ENEMA | Freq: Once | RECTAL | Status: DC
  Filled 2016-10-29: qty 1

## 2016-10-29 MED ORDER — PHENYLEPHRINE HCL 10 MG/ML IJ SOLN
INTRAMUSCULAR | Status: DC | PRN
  Administered 2016-10-29 (×2): 120 ug via INTRAVENOUS
  Administered 2016-10-29 (×2): 80 ug via INTRAVENOUS

## 2016-10-29 MED ORDER — OXYCODONE-ACETAMINOPHEN 5-325 MG PO TABS: 1.0000 | ORAL_TABLET | Freq: Four times a day (QID) | ORAL | 0 refills | Status: AC | PRN

## 2016-10-29 MED ORDER — ONDANSETRON HCL 4 MG/2ML IJ SOLN
INTRAMUSCULAR | Status: DC | PRN
  Administered 2016-10-29: 4 mg via INTRAVENOUS

## 2016-10-29 MED ORDER — DEXAMETHASONE SODIUM PHOSPHATE 10 MG/ML IJ SOLN
INTRAMUSCULAR | Status: DC | PRN
  Administered 2016-10-29: 10 mg via INTRAVENOUS

## 2016-10-29 MED ORDER — AMOXICILLIN-POT CLAVULANATE 875-125 MG PO TABS: 1.0000 | ORAL_TABLET | Freq: Two times a day (BID) | ORAL | 0 refills | Status: AC

## 2016-10-29 MED ORDER — PHENYLEPHRINE HCL 10 MG/ML IJ SOLN
INTRAMUSCULAR | Status: DC | PRN
  Administered 2016-10-29: 50 ug/min via INTRAVENOUS

## 2016-10-29 MED ORDER — PHENYLEPHRINE 40 MCG/ML (10ML) SYRINGE FOR IV PUSH (FOR BLOOD PRESSURE SUPPORT)
PREFILLED_SYRINGE | INTRAVENOUS | Status: AC
  Filled 2016-10-29: qty 10

## 2016-10-29 MED ORDER — MIDAZOLAM HCL 2 MG/2ML IJ SOLN
INTRAMUSCULAR | Status: DC | PRN
  Administered 2016-10-29: 2 mg via INTRAVENOUS

## 2016-10-29 MED ORDER — BELLADONNA ALKALOIDS-OPIUM 16.2-60 MG RE SUPP
RECTAL | Status: AC
  Filled 2016-10-29: qty 1

## 2016-10-29 MED ORDER — LIDOCAINE 2% (20 MG/ML) 5 ML SYRINGE
INTRAMUSCULAR | Status: DC | PRN
  Administered 2016-10-29: 100 mg via INTRAVENOUS

## 2016-10-29 MED ORDER — FENTANYL CITRATE (PF) 100 MCG/2ML IJ SOLN
25.0000 ug | INTRAMUSCULAR | Status: DC | PRN
  Filled 2016-10-29: qty 1

## 2016-10-29 MED ORDER — MEPERIDINE HCL 25 MG/ML IJ SOLN
6.2500 mg | INTRAMUSCULAR | Status: DC | PRN
  Filled 2016-10-29: qty 1

## 2016-10-29 MED ORDER — PROPOFOL 10 MG/ML IV BOLUS
INTRAVENOUS | Status: AC
  Filled 2016-10-29: qty 20

## 2016-10-29 MED ORDER — PHENYLEPHRINE HCL 10 MG/ML IJ SOLN
INTRAMUSCULAR | Status: AC
  Filled 2016-10-29: qty 1

## 2016-10-29 SURGICAL SUPPLY — 25 items
BLADE CLIPPER SURG (BLADE) ×3 IMPLANT
CATH FOLEY 2WAY SLVR  5CC 16FR (CATHETERS) ×2
CATH FOLEY 2WAY SLVR 5CC 16FR (CATHETERS) ×4 IMPLANT
CATH ROBINSON RED A/P 20FR (CATHETERS) ×3 IMPLANT
CLOTH BEACON ORANGE TIMEOUT ST (SAFETY) ×3 IMPLANT
COVER BACK TABLE 60X90IN (DRAPES) ×3 IMPLANT
COVER MAYO STAND STRL (DRAPES) ×3 IMPLANT
DRSG TEGADERM 4X4.75 (GAUZE/BANDAGES/DRESSINGS) ×3 IMPLANT
DRSG TEGADERM 8X12 (GAUZE/BANDAGES/DRESSINGS) ×3 IMPLANT
GLOVE ECLIPSE 8.0 STRL XLNG CF (GLOVE) IMPLANT
GLOVE SURG SIGNA 7.5 PF LTX (GLOVE) ×6 IMPLANT
GOWN SPEC L3 MED W/TWL (GOWN DISPOSABLE) ×3 IMPLANT
HOLDER FOLEY CATH W/STRAP (MISCELLANEOUS) ×3 IMPLANT
IMPL SPACEOAR SYSTEM 10ML (MISCELLANEOUS) IMPLANT
IMPLANT SPACEOAR SYSTEM 10ML (MISCELLANEOUS)
IV NS 1000ML (IV SOLUTION) ×1
IV NS 1000ML BAXH (IV SOLUTION) ×2 IMPLANT
KIT RM TURNOVER CYSTO AR (KITS) ×3 IMPLANT
PACK CYSTO (CUSTOM PROCEDURE TRAY) ×3 IMPLANT
SPONGE GAUZE 2X2 8PLY STRL LF (GAUZE/BANDAGES/DRESSINGS) ×3 IMPLANT
SUT BONE WAX W31G (SUTURE) ×3 IMPLANT
SYRINGE 10CC LL (SYRINGE) ×3 IMPLANT
UNDERPAD 30X30 INCONTINENT (UNDERPADS AND DIAPERS) ×6 IMPLANT
WATER STERILE IRR 500ML POUR (IV SOLUTION) ×3 IMPLANT
selectseed I-125 ×3 IMPLANT

## 2016-10-29 NOTE — Anesthesia Postprocedure Evaluation (Signed)
Anesthesia Post Note  Patient: Dean Burton  Procedure(s) Performed: Procedure(s) (LRB): RADIOACTIVE SEED IMPLANT/BRACHYTHERAPY IMPLANT (N/A) CYSTOSCOPY     Patient location during evaluation: PACU Anesthesia Type: General Level of consciousness: awake and alert Pain management: pain level controlled Vital Signs Assessment: post-procedure vital signs reviewed and stable Respiratory status: spontaneous breathing, nonlabored ventilation, respiratory function stable and patient connected to nasal cannula oxygen Cardiovascular status: blood pressure returned to baseline and stable Postop Assessment: no apparent nausea or vomiting Anesthetic complications: no    Last Vitals:  Vitals:   10/29/16 1630 10/29/16 1645  BP: 112/73 112/74  Pulse: 90 91  Resp: 15 18  Temp:  36.7 C  SpO2: 92% 94%    Last Pain:  Vitals:   10/29/16 1222  TempSrc: Oral                 Effie Berkshire

## 2016-10-29 NOTE — Discharge Instructions (Signed)
°  Post Anesthesia Home Care Instructions ° °Activity: °Get plenty of rest for the remainder of the day. A responsible individual must stay with you for 24 hours following the procedure.  °For the next 24 hours, DO NOT: °-Drive a car °-Operate machinery °-Drink alcoholic beverages °-Take any medication unless instructed by your physician °-Make any legal decisions or sign important papers. ° °Meals: °Start with liquid foods such as gelatin or soup. Progress to regular foods as tolerated. Avoid greasy, spicy, heavy foods. If nausea and/or vomiting occur, drink only clear liquids until the nausea and/or vomiting subsides. Call your physician if vomiting continues. ° °Special Instructions/Symptoms: °Your throat may feel dry or sore from the anesthesia or the breathing tube placed in your throat during surgery. If this causes discomfort, gargle with warm salt water. The discomfort should disappear within 24 hours. ° °If you had a scopolamine patch placed behind your ear for the management of post- operative nausea and/or vomiting: ° °1. The medication in the patch is effective for 72 hours, after which it should be removed.  Wrap patch in a tissue and discard in the trash. Wash hands thoroughly with soap and water. °2. You may remove the patch earlier than 72 hours if you experience unpleasant side effects which may include dry mouth, dizziness or visual disturbances. °3. Avoid touching the patch. Wash your hands with soap and water after contact with the patch. °  ° ° ° ° °Radioactive Seed Implant Home Care Instructions ° ° °Activity:    Rest for the remainder of the day.  Do not drive or operate equipment today.  You may resume normal  activities in a few days as instructed by your physician, without risk of harmful radiation exposure to those around you, provided you follow the time and distance precautions on the Radiation Oncology Instruction Sheet. ° ° °Meals: Drink plenty of lipuids and eat light foods, such as  gelatin or soup this evening .  You may return to normal meal plan tomorrow. ° °Return °To Work: You may return to work as instructed by your physician. ° °Special °Instruction:   If any seeds are found, use tweezers to pick up seeds and place in a glass container of any kind and bring to your physician's office. ° °Call your physician if any of these symptoms occur: ° °· Persistent or heavy bleeding °· Urine stream diminishes or stops completely after catheter is removed °· Fever equal to or greater than 101 degrees F °· Cloudy urine with a strong foul odor °· Severe pain ° °You may feel some burning pain and/or hesitancy when you urinate after the catheter is removed.  These symptoms may increase over the next few weeks, but should diminish within forur to six weeks.  Applying moist heat to the lower abdomen or a hot tub bath may help relieve the pain.  If the discomfort becomes severe, please call your physician for additional medications. ° °

## 2016-10-29 NOTE — Brief Op Note (Signed)
10/29/2016  4:01 PM  PATIENT:  Amada Kingfisher  56 y.o. male  PRE-OPERATIVE DIAGNOSIS:  [place]  POST-OPERATIVE DIAGNOSIS:  PROSTACTE CANCER C61  PROCEDURE:  Procedure(s) with comments: RADIOACTIVE SEED IMPLANT/BRACHYTHERAPY IMPLANT (N/A) - 64 seeds implanted CYSTOSCOPY - no seeds found in bladder  SURGEON:  Surgeon(s) and Role:    Joie Bimler, MD - Primary    * Tyler Pita, MD - Assisting  PHYSICIAN ASSISTANT:   ASSISTANTS: none   ANESTHESIA:   general  EBL:  Total I/O In: 1100 [I.V.:1050; IV Piggyback:50] Out: 10 [Blood:10]  BLOOD ADMINISTERED:none  DRAINS: Urinary Catheter (Foley)   LOCAL MEDICATIONS USED:  B&O SUPP  SPECIMEN:  No Specimen  DISPOSITION OF SPECIMEN:  N/A  COUNTS:  YES  TOURNIQUET:  * No tourniquets in log *  DICTATION: .Op report # [id number]  PLAN OF CARE: Discharge to home after PACU  PATIENT DISPOSITION:  PACU - hemodynamically stable.   Delay start of Pharmacological VTE agent (>24hrs) due to surgical blood loss or risk of bleeding: yes

## 2016-10-29 NOTE — Transfer of Care (Signed)
Immediate Anesthesia Transfer of Care Note  Patient: Dean Burton  Procedure(s) Performed: Procedure(s) with comments: RADIOACTIVE SEED IMPLANT/BRACHYTHERAPY IMPLANT (N/A) - 50 seeds implanted CYSTOSCOPY - no seeds found in bladder  Patient Location: PACU  Anesthesia Type:General  Level of Consciousness: awake, alert , oriented and patient cooperative  Airway & Oxygen Therapy: Patient Spontanous Breathing and Patient connected to nasal cannula oxygen  Post-op Assessment: Report given to RN and Post -op Vital signs reviewed and stable  Post vital signs: Reviewed and stable  Last Vitals:  Vitals:   10/29/16 1222  BP: 124/82  Pulse: (!) 113  Resp: 18  Temp: 37.1 C  SpO2: 96%    Last Pain:  Vitals:   10/29/16 1222  TempSrc: Oral         Complications: No apparent anesthesia complications

## 2016-10-29 NOTE — Interval H&P Note (Signed)
History and Physical Interval Note:  10/29/2016 2:18 PM  Dean Burton  has presented today for surgery, with the diagnosis of PROSTACTE CANCER C61  The various methods of treatment have been discussed with the patient and family. After consideration of risks, benefits and other options for treatment, the patient has consented to  Procedure(s) with comments: RADIOACTIVE SEED IMPLANT/BRACHYTHERAPY IMPLANT (N/A) -  seeds implanted CYSTOSCOPY - no seeds found in bladder as a surgical intervention .  The patient's history has been reviewed, patient examined, no change in status, stable for surgery.  I have reviewed the patient's chart and labs.  Questions were answered to the patient's satisfaction.     Dean Burton

## 2016-10-29 NOTE — Anesthesia Procedure Notes (Signed)
Procedure Name: LMA Insertion Date/Time: 10/29/2016 2:23 PM Performed by: Wanita Chamberlain Pre-anesthesia Checklist: Patient identified, Emergency Drugs available, Suction available, Patient being monitored and Timeout performed Patient Re-evaluated:Patient Re-evaluated prior to induction Oxygen Delivery Method: Circle system utilized Preoxygenation: Pre-oxygenation with 100% oxygen Induction Type: IV induction Ventilation: Mask ventilation without difficulty LMA: LMA inserted LMA Size: 5.0 Number of attempts: 1 Placement Confirmation: breath sounds checked- equal and bilateral,  CO2 detector and positive ETCO2 Tube secured with: Tape Dental Injury: Teeth and Oropharynx as per pre-operative assessment

## 2016-10-29 NOTE — Anesthesia Preprocedure Evaluation (Addendum)
Anesthesia Evaluation  Patient identified by MRN, date of birth, ID band Patient awake    Reviewed: Allergy & Precautions, NPO status , Patient's Chart, lab work & pertinent test results  Airway Mallampati: II  TM Distance: >3 FB Neck ROM: Full    Dental no notable dental hx. (+) Upper Dentures, Lower Dentures   Pulmonary Current Smoker,    Pulmonary exam normal breath sounds clear to auscultation       Cardiovascular hypertension, Pt. on medications Normal cardiovascular exam Rhythm:Regular Rate:Normal     Neuro/Psych negative neurological ROS  negative psych ROS   GI/Hepatic negative GI ROS, Neg liver ROS, GERD  Medicated and Controlled,  Endo/Other  negative endocrine ROS  Renal/GU negative Renal ROS  negative genitourinary   Musculoskeletal negative musculoskeletal ROS (+)   Abdominal   Peds negative pediatric ROS (+)  Hematology negative hematology ROS (+)   Anesthesia Other Findings   Reproductive/Obstetrics negative OB ROS                           Anesthesia Physical Anesthesia Plan  ASA: II  Anesthesia Plan: General   Post-op Pain Management:    Induction: Intravenous  PONV Risk Score and Plan: 2 and Ondansetron, Dexamethasone and Treatment may vary due to age or medical condition  Airway Management Planned: LMA and Oral ETT  Additional Equipment:   Intra-op Plan:   Post-operative Plan: Extubation in OR  Informed Consent: I have reviewed the patients History and Physical, chart, labs and discussed the procedure including the risks, benefits and alternatives for the proposed anesthesia with the patient or authorized representative who has indicated his/her understanding and acceptance.   Dental advisory given  Plan Discussed with: CRNA  Anesthesia Plan Comments:         Anesthesia Quick Evaluation

## 2016-10-29 NOTE — Progress Notes (Signed)
  Radiation Oncology         [phone] ________________________________  Name: Dean Burton MRN: [id number]  Date: 10/29/2016  DOB: 1960-12-22       Prostate Seed Implant  CC:Marco Collie, MD  No ref. provider found  DIAGNOSIS: 56 year-old gentleman with Stage T1c adenocarcinoma of the prostate with Gleason Score of 3+4, and PSA of 5.8  PROCEDURE: Insertion of radioactive I-125 seeds into the prostate gland.  RADIATION DOSE: 145 Gy, definitive therapy.  TECHNIQUE: Dean Burton was brought to the operating room with the urologist. He was placed in the dorsolithotomy position. He was catheterized and a rectal tube was inserted. The perineum was shaved, prepped and draped. The ultrasound probe was then introduced into the rectum to see the prostate gland.  TREATMENT DEVICE: A needle grid was attached to the ultrasound probe stand and anchor needles were placed.  3D PLANNING: The prostate was imaged in 3D using a sagittal sweep of the prostate probe. These images were transferred to the planning computer. There, the prostate, urethra and rectum were defined on each axial reconstructed image. Then, the software created an optimized 3D plan and a few seed positions were adjusted. The quality of the plan was reviewed using [facility] information for the target and the following two organs at risk:  Urethra and Rectum.  Then the accepted plan was uploaded to the seed Selectron afterloading unit.  PROSTATE VOLUME STUDY:  Using transrectal ultrasound the volume of the prostate was verified to be 30.1 cc.  SPECIAL TREATMENT PROCEDURE/SUPERVISION AND HANDLING: The Nucletron FIRST system was used to place the needles under sagittal guidance. A total of 20 needles were used to deposit 64 seeds in the prostate gland. The individual seed activity was 0.393 mCi.  SpaceOAR:  No  COMPLEX SIMULATION: At the end of the procedure, an anterior radiograph of the pelvis was obtained to document seed positioning and  count. Cystoscopy was performed to check the urethra and bladder.  MICRODOSIMETRY: At the end of the procedure, the patient was emitting 0.05 mR/hr at 1 meter. Accordingly, he was considered safe for hospital discharge.  PLAN: The patient will return to the radiation oncology clinic for post implant CT dosimetry in three weeks.   ________________________________  Sheral Apley Tammi Klippel, M.D.

## 2016-10-30 ENCOUNTER — Encounter (HOSPITAL_BASED_OUTPATIENT_CLINIC_OR_DEPARTMENT_OTHER): Payer: Self-pay | Admitting: Urology

## 2016-10-30 DIAGNOSIS — C61 Malignant neoplasm of prostate: Secondary | ICD-10-CM | POA: Diagnosis not present

## 2016-10-30 DIAGNOSIS — R339 Retention of urine, unspecified: Secondary | ICD-10-CM | POA: Diagnosis not present

## 2016-10-30 NOTE — Op Note (Signed)
NAMELEONDRO, CORYELL NO.:  [id number]  MEDICAL RECORD NO.:  [id number]  LOCATION:                                 FACILITY:  PHYSICIAN:  Joie Bimler, M.D.          DATE OF BIRTH:  DATE OF PROCEDURE:  10/29/2016 DATE OF DISCHARGE:                              OPERATIVE REPORT   PREOPERATIVE DIAGNOSIS:  Stage T1c adenocarcinoma of the prostate.  POSTOPERATIVE DIAGNOSIS:  Stage T1c adenocarcinoma of the prostate.  PROCEDURES: 1. Transrectal ultrasound of the prostate. 2. I-125 radioactive seed implantation. 3. Cystoscopy.  SURGEONS:  Joie Bimler, M.D. and Lora Paula, M.D.  ANESTHESIA USED:  General.  INDICATIONS FOR PROCEDURE:  The patient is a 56 year old white male with a diagnosis of prostate cancer and is brought in now for definitive therapy with radioactive seed implantation.  DESCRIPTION OF PROCEDURE:  The patient was brought into the operating room and placed on the table in the supine position.  After adequate general anesthesia was achieved, he was carefully placed in exaggerated lithotomy with the Yellofin stirrups.  Foley catheter was then placed to drain the bladder and the scrotum was elevated onto the lower abdomen and taped in place.  The red rubber catheter was placed into the rectum to relieve excess air.  The perineum was then shaved, prepped, and draped for the performance of the procedure.  Transrectal ultrasound probe was then placed and the prostate was imaged from apex to base and prostatic anchors were then placed.  Prostate was then scanned from apex to seminal vesicles using the ultrasound.  The images were then fed into the __________ program.  Images were then reviewed in the program and the individual images were then marked to mark prostatic capsule border, urethra, and rectum.  The computer program then calculated appropriate seed placement.  All of the individual images were then reviewed by myself and Dr.  Tammi Klippel to ascertain seed placement.  With a good coverage of the prostate, special attention was paid to the right and the left bases.  Sparing of the urethra and rectum were also monitored. Seed implantation was then begun, the bladder neck was identified with the Foley balloon in place with contrast.  Needles were then placed from anterior to posterior on the prostate to encompass the prostate in its entirety.  No portion of the prostate was left untreated.  Once the final seeds were placed, the anchoring needles were removed and pelvic x- ray was obtained, showing a good distribution of the seeds.  Foley catheter was then removed and the patient was re-prepped and draped. Flexible cystoscopy was then performed and this showed normal anterior urethra.  No seeds or spacers were within the prostate or bladder.  The bladder itself showed normal ureteral orifices and trigone and no lesions.  Cystoscope was then removed and under sterile conditions, Foley catheter was replaced into the bladder. Rectal exam was performed.  This showed no foreign bodies in the rectum. B and O suppository was then placed for local analgesia.  The patient tolerated all this well.  He was then awakened and taken to the recovery room in good condition.  ______________________________ Joie Bimler, M.D.   ______________________________ Joie Bimler, M.D.    RC/MEDQ  D:  10/29/2016  T:  10/29/2016  Job:  [id number]

## 2016-11-19 ENCOUNTER — Telehealth: Payer: Self-pay | Admitting: *Deleted

## 2016-11-19 NOTE — Telephone Encounter (Signed)
CALLED PATIENT TO REMIND OF POST SEED APPTS. FOR 11-20-16, SPOKE WITH PATIENT'S WIFE MARY AND SHE IS AWARE OF THESE APPTS.

## 2016-11-20 ENCOUNTER — Ambulatory Visit
Admission: RE | Admit: 2016-11-20 | Discharge: 2016-11-20 | Disposition: A | Discharge status: Home / Self Care | Payer: 59 | Source: Ambulatory Visit | Attending: Radiation Oncology | Admitting: Radiation Oncology

## 2016-11-20 ENCOUNTER — Encounter: Payer: Self-pay | Admitting: Medical Oncology

## 2016-11-20 VITALS — BP 145/101 | HR 101 | Temp 98.0°F | Resp 16

## 2016-11-20 DIAGNOSIS — Z923 Personal history of irradiation: Secondary | ICD-10-CM | POA: Diagnosis not present

## 2016-11-20 DIAGNOSIS — C61 Malignant neoplasm of prostate: Secondary | ICD-10-CM

## 2016-11-20 NOTE — Progress Notes (Signed)
  Radiation Oncology         [phone] ________________________________  Name: Dean Burton MRN: [id number]  Date: 11/20/2016  DOB: [birth year]  COMPLEX SIMULATION NOTE  NARRATIVE:  The patient was brought to the [place] today following prostate seed implantation approximately one month ago.  Identity was confirmed.  All relevant records and images related to the planned course of therapy were reviewed.  Then, the patient was set-up supine.  CT images were obtained.  The CT images were loaded into the planning software.  Then the prostate and rectum were contoured.  Treatment planning then occurred.  The implanted iodine 125 seeds were identified by the physics staff for projection of radiation distribution  I have requested : 3D Simulation  I have requested a DVH of the following structures: Prostate and rectum.    ________________________________  Sheral Apley Tammi Klippel, M.D.  This document serves as a record of services personally performed by Tyler Pita, MD. It was created on his behalf by Rae Lips, a trained medical scribe. The creation of this record is based on the scribe's personal observations and the provider's statements to them. This document has been checked and approved by the attending provider.

## 2016-11-20 NOTE — Progress Notes (Signed)
Diastolic BP elevated. Patient states, "I have white coat syndrome." Patient denies headache, dizziness or chest pain. Pre seed IPSS 0. Post seed IPSS 22. Patient scheduled to follow up with Dr. Nila Nephew next week. Denies dysuria, hematuria, leakage or incontinence.   BP (!) 145/101 (BP Location: Left Arm, Patient Position: Sitting, Cuff Size: Normal)   Pulse (!) 101   Temp 98 F (36.7 C) (Oral)   Resp 16   SpO2 100%

## 2016-11-20 NOTE — Progress Notes (Signed)
Radiation Oncology         [phone]) [phone] ________________________________  Name: Dean Burton MRN: [id number]  Date: 11/20/2016  DOB: Oct 02, 1960  Follow-Up Visit Note  CC: Marco Collie, MD  Joie Bimler, MD  Diagnosis:   56 y.o. male with with Stage T1cadenocarcinoma of the prostate with Gleason Score of 3+4, and PSA of 5.8    ICD-10-CM   1. Malignant neoplasm of prostate ([place]) C61     Interval Since Last Radiation:  3 weeks 10/29/16:  Insertion of radioactive I-125 seeds into the prostate gland, 145 Gy definitive therapy  Narrative:  The patient returns today for routine follow-up.  He is complaining of increased urinary frequency and urinary hesitation symptoms. He filled out a questionnaire regarding urinary function today providing and overall IPSS score of 22 characterizing his symptoms as severe.  His pre-implant score was 0. He denies any bowel symptoms. His biggest  Complaint is of feeling like he is not able to completely empty his bladder on voiding.  He reports being able to produce a fair volume of urine with each void and occasionally has a strong urine stream but in general, it is a mildly weak stream with urgency, intermittency, hesitancy and nocturia x1/night.  He denies gross hematuria, dysuria or incontinence.  ALLERGIES:  is allergic to ciprofloxacin and levaquin [levofloxacin].  Meds: Current Outpatient Prescriptions  Medication Sig Dispense Refill  . amLODipine (NORVASC) 2.5 MG tablet Take 2.5 mg by mouth daily.  1  . BEPREVE 1.5 % SOLN INSTILL 1 DROP 1-2 TIMES DAILY AS NEEDED FOR ITCHY WATERY EYES  1  . Calcium-Magnesium-Vitamin D (CALCIUM 1200+D3 PO) Take 2 tablets by mouth daily.    . citalopram (CELEXA) 20 MG tablet Take 20 mg by mouth every evening.   1  . finasteride (PROSCAR) 5 MG tablet Take 5 mg by mouth every evening.   3  . fluticasone (FLONASE) 50 MCG/ACT nasal spray USE 2 SPRAYS PER NOSTRIL DAILY--- as needed  3  . losartan-hydrochlorothiazide  (HYZAAR) 100-25 MG tablet 1 (ONE) TABLET BY MOUTH DAILY for hypertension--- takes in evening  3  . omeprazole (PRILOSEC) 40 MG capsule Take 40 mg by mouth every evening.   10  . oxyCODONE (OXY IR/ROXICODONE) 5 MG immediate release tablet TAKE 1-2 TABLETS BY MOUTH EVERY 4-6 HOURS AS NEEDED  0  . oxyCODONE-acetaminophen (ROXICET) 5-325 MG tablet Take 1 tablet by mouth every 6 (six) hours as needed. 20 tablet 0  . rosuvastatin (CRESTOR) 5 MG tablet Take 5 mg by mouth at bedtime.   2  . tamsulosin (FLOMAX) 0.4 MG CAPS capsule TAKE ONE CAPSULE BY MOUTH EVERY DAY 1/2 HOUR AFTER SUPPER  3  . oxyCODONE-acetaminophen (PERCOCET/ROXICET) 5-325 MG tablet Take by mouth every 4 (four) hours as needed for severe pain.     No current facility-administered medications for this encounter.     Physical Findings: In general this is a well appearing caucasian male in no acute distress. He's alert and oriented x4 and appropriate throughout the examination. Cardiopulmonary assessment is negative for acute distress and he exhibits normal effort.   Lab Findings: No results found for: WBC, HGB, HCT, MCV, PLT  Radiographic Findings:  Patient underwent CT imaging in our clinic for post implant dosimetry. The CT was reviewed by Dr. Tammi Klippel and appears to demonstrate an adequate distribution of radioactive seeds throughout the prostate gland. There no seeds in or near the rectum. We suspect the final radiation plan and dosimetry will show appropriate coverage  of the prostate gland.   Impression/Plan: The patient is recovering from the effects of radiation. His urinary symptoms should gradually improve over the next 4-6 months. We talked about this today. He is encouraged by his improvement already and is otherwise pleased with his outcome. We also talked about long-term follow-up for prostate cancer following seed implant. He understands that ongoing PSA determinations and digital rectal exams will help perform surveillance  to rule out disease recurrence. He has a follow up appointment scheduled next week with Dr. Nila Nephew. He understands what to expect with his PSA measures. Patient was also educated today about some of the long-term effects from radiation including a small risk for rectal bleeding and possibly erectile dysfunction. We talked about some of the general management approaches to these potential complications. However, we did encourage the patient to contact our office or return at any point if he has questions or concerns related to his previous radiation and prostate cancer.    Nicholos Johns, PA-C    Tyler Pita, MD  [facility] Direct Dial: [phone]  Fax: [phone] [email or web address]  Skype  LinkedIn    Page Me   This document serves as a record of services personally performed by Tyler Pita, MD and Freeman Caldron, PA-C. It was created on their behalf by Rae Lips, a trained medical scribe. The creation of this record is based on the scribe's personal observations and the providers' statements to them. This document has been checked and approved by the attending providers.

## 2016-11-23 NOTE — Progress Notes (Signed)
Dean Burton states the seed implant surgery went well except now he is having lots of urinary symptoms. He has increased urination, hesitancy and states he is not getting any sleep. He did states that his symptoms are gradually improving. We discussed how these side effects will continue to improve over the next few months. He has follow up with Dr. Nila Nephew next week. I asked him to call me if he has questions or concerns. He voiced understanding.

## 2016-11-26 ENCOUNTER — Encounter: Payer: Self-pay | Admitting: Radiation Oncology

## 2016-11-26 DIAGNOSIS — C61 Malignant neoplasm of prostate: Secondary | ICD-10-CM | POA: Diagnosis not present

## 2016-11-27 DIAGNOSIS — R339 Retention of urine, unspecified: Secondary | ICD-10-CM | POA: Diagnosis not present

## 2016-11-27 DIAGNOSIS — C61 Malignant neoplasm of prostate: Secondary | ICD-10-CM | POA: Diagnosis not present

## 2016-11-29 NOTE — Progress Notes (Signed)
  Radiation Oncology         [phone] ________________________________  Name: Dallis Czaja MRN: [id number]  Date: 11/26/2016  DOB: Jun 22, 1960  3D Planning Note   Prostate Brachytherapy Post-Implant Dosimetry  Diagnosis: 56 year-old gentleman with Stage T1c adenocarcinoma of the prostate with Gleason Score of 3+4, and PSA of 5.8  Narrative: On a previous date, Raden Byington returned following prostate seed implantation for post implant planning. He underwent CT scan complex simulation to delineate the three-dimensional structures of the pelvis and demonstrate the radiation distribution.  Since that time, the seed localization, and complex isodose planning with dose volume histograms have now been completed.  Results:   Prostate Coverage - The dose of radiation delivered to the 90% or more of the prostate gland (D90) was 89.41% of the prescription dose, and our goal is greater than 90%. Rectal Sparing - The volume of rectal tissue receiving the prescription dose or higher was 0.09 cc. This falls under our thresholds tolerance of 1.0 cc.  Impression: The prostate seed implant appears to show adequate target coverage and appropriate rectal sparing.  Plan:  The patient will continue to follow with urology for ongoing PSA determinations. I would anticipate a high likelihood for local tumor control with minimal risk for rectal morbidity.  ________________________________  Sheral Apley Tammi Klippel, M.D.

## 2016-11-30 DIAGNOSIS — Z23 Encounter for immunization: Secondary | ICD-10-CM | POA: Diagnosis not present

## 2017-02-08 DIAGNOSIS — C61 Malignant neoplasm of prostate: Secondary | ICD-10-CM | POA: Diagnosis not present

## 2017-02-08 DIAGNOSIS — N529 Male erectile dysfunction, unspecified: Secondary | ICD-10-CM | POA: Diagnosis not present

## 2017-03-22 DIAGNOSIS — E782 Mixed hyperlipidemia: Secondary | ICD-10-CM | POA: Diagnosis not present

## 2017-03-22 DIAGNOSIS — R7309 Other abnormal glucose: Secondary | ICD-10-CM | POA: Diagnosis not present

## 2017-03-22 DIAGNOSIS — I10 Essential (primary) hypertension: Secondary | ICD-10-CM | POA: Diagnosis not present

## 2017-05-08 DIAGNOSIS — H81399 Other peripheral vertigo, unspecified ear: Secondary | ICD-10-CM | POA: Diagnosis not present

## 2017-05-08 DIAGNOSIS — H6123 Impacted cerumen, bilateral: Secondary | ICD-10-CM | POA: Diagnosis not present

## 2017-05-10 DIAGNOSIS — N529 Male erectile dysfunction, unspecified: Secondary | ICD-10-CM | POA: Diagnosis not present

## 2017-05-10 DIAGNOSIS — C61 Malignant neoplasm of prostate: Secondary | ICD-10-CM | POA: Diagnosis not present

## 2017-07-15 DIAGNOSIS — E782 Mixed hyperlipidemia: Secondary | ICD-10-CM | POA: Diagnosis not present

## 2017-07-15 DIAGNOSIS — I10 Essential (primary) hypertension: Secondary | ICD-10-CM | POA: Diagnosis not present

## 2017-07-19 DIAGNOSIS — I10 Essential (primary) hypertension: Secondary | ICD-10-CM | POA: Diagnosis not present

## 2017-07-19 DIAGNOSIS — E782 Mixed hyperlipidemia: Secondary | ICD-10-CM | POA: Diagnosis not present

## 2017-07-19 DIAGNOSIS — C61 Malignant neoplasm of prostate: Secondary | ICD-10-CM | POA: Diagnosis not present

## 2017-08-02 DIAGNOSIS — C61 Malignant neoplasm of prostate: Secondary | ICD-10-CM | POA: Diagnosis not present

## 2017-10-13 DIAGNOSIS — R7303 Prediabetes: Secondary | ICD-10-CM | POA: Diagnosis not present

## 2017-10-13 DIAGNOSIS — E782 Mixed hyperlipidemia: Secondary | ICD-10-CM | POA: Diagnosis not present

## 2017-10-13 DIAGNOSIS — I10 Essential (primary) hypertension: Secondary | ICD-10-CM | POA: Diagnosis not present

## 2017-10-18 DIAGNOSIS — I10 Essential (primary) hypertension: Secondary | ICD-10-CM | POA: Diagnosis not present

## 2017-10-18 DIAGNOSIS — E782 Mixed hyperlipidemia: Secondary | ICD-10-CM | POA: Diagnosis not present

## 2017-10-18 DIAGNOSIS — Z23 Encounter for immunization: Secondary | ICD-10-CM | POA: Diagnosis not present

## 2017-11-22 DIAGNOSIS — R300 Dysuria: Secondary | ICD-10-CM | POA: Diagnosis not present

## 2017-11-22 DIAGNOSIS — C61 Malignant neoplasm of prostate: Secondary | ICD-10-CM | POA: Diagnosis not present

## 2017-11-22 DIAGNOSIS — N390 Urinary tract infection, site not specified: Secondary | ICD-10-CM | POA: Diagnosis not present

## 2017-11-22 DIAGNOSIS — Z7689 Persons encountering health services in other specified circumstances: Secondary | ICD-10-CM | POA: Diagnosis not present

## 2017-12-06 DIAGNOSIS — J069 Acute upper respiratory infection, unspecified: Secondary | ICD-10-CM | POA: Diagnosis not present

## 2018-01-12 DIAGNOSIS — E782 Mixed hyperlipidemia: Secondary | ICD-10-CM | POA: Diagnosis not present

## 2018-01-12 DIAGNOSIS — R7303 Prediabetes: Secondary | ICD-10-CM | POA: Diagnosis not present

## 2018-01-12 DIAGNOSIS — I10 Essential (primary) hypertension: Secondary | ICD-10-CM | POA: Diagnosis not present

## 2018-02-07 DIAGNOSIS — I10 Essential (primary) hypertension: Secondary | ICD-10-CM | POA: Diagnosis not present

## 2018-02-07 DIAGNOSIS — E782 Mixed hyperlipidemia: Secondary | ICD-10-CM | POA: Diagnosis not present

## 2018-02-07 DIAGNOSIS — R7303 Prediabetes: Secondary | ICD-10-CM | POA: Diagnosis not present

## 2018-03-18 DIAGNOSIS — J069 Acute upper respiratory infection, unspecified: Secondary | ICD-10-CM | POA: Diagnosis not present

## 2018-04-26 IMAGING — DX DG CHEST 2V
2 series · 2 of 2 positions shown · non-contrast
Comparison: None.

CLINICAL DATA: Smoker, preop for prostate cancer history of
hypertension

EXAM:
CHEST  2 VIEW

[chest pa]
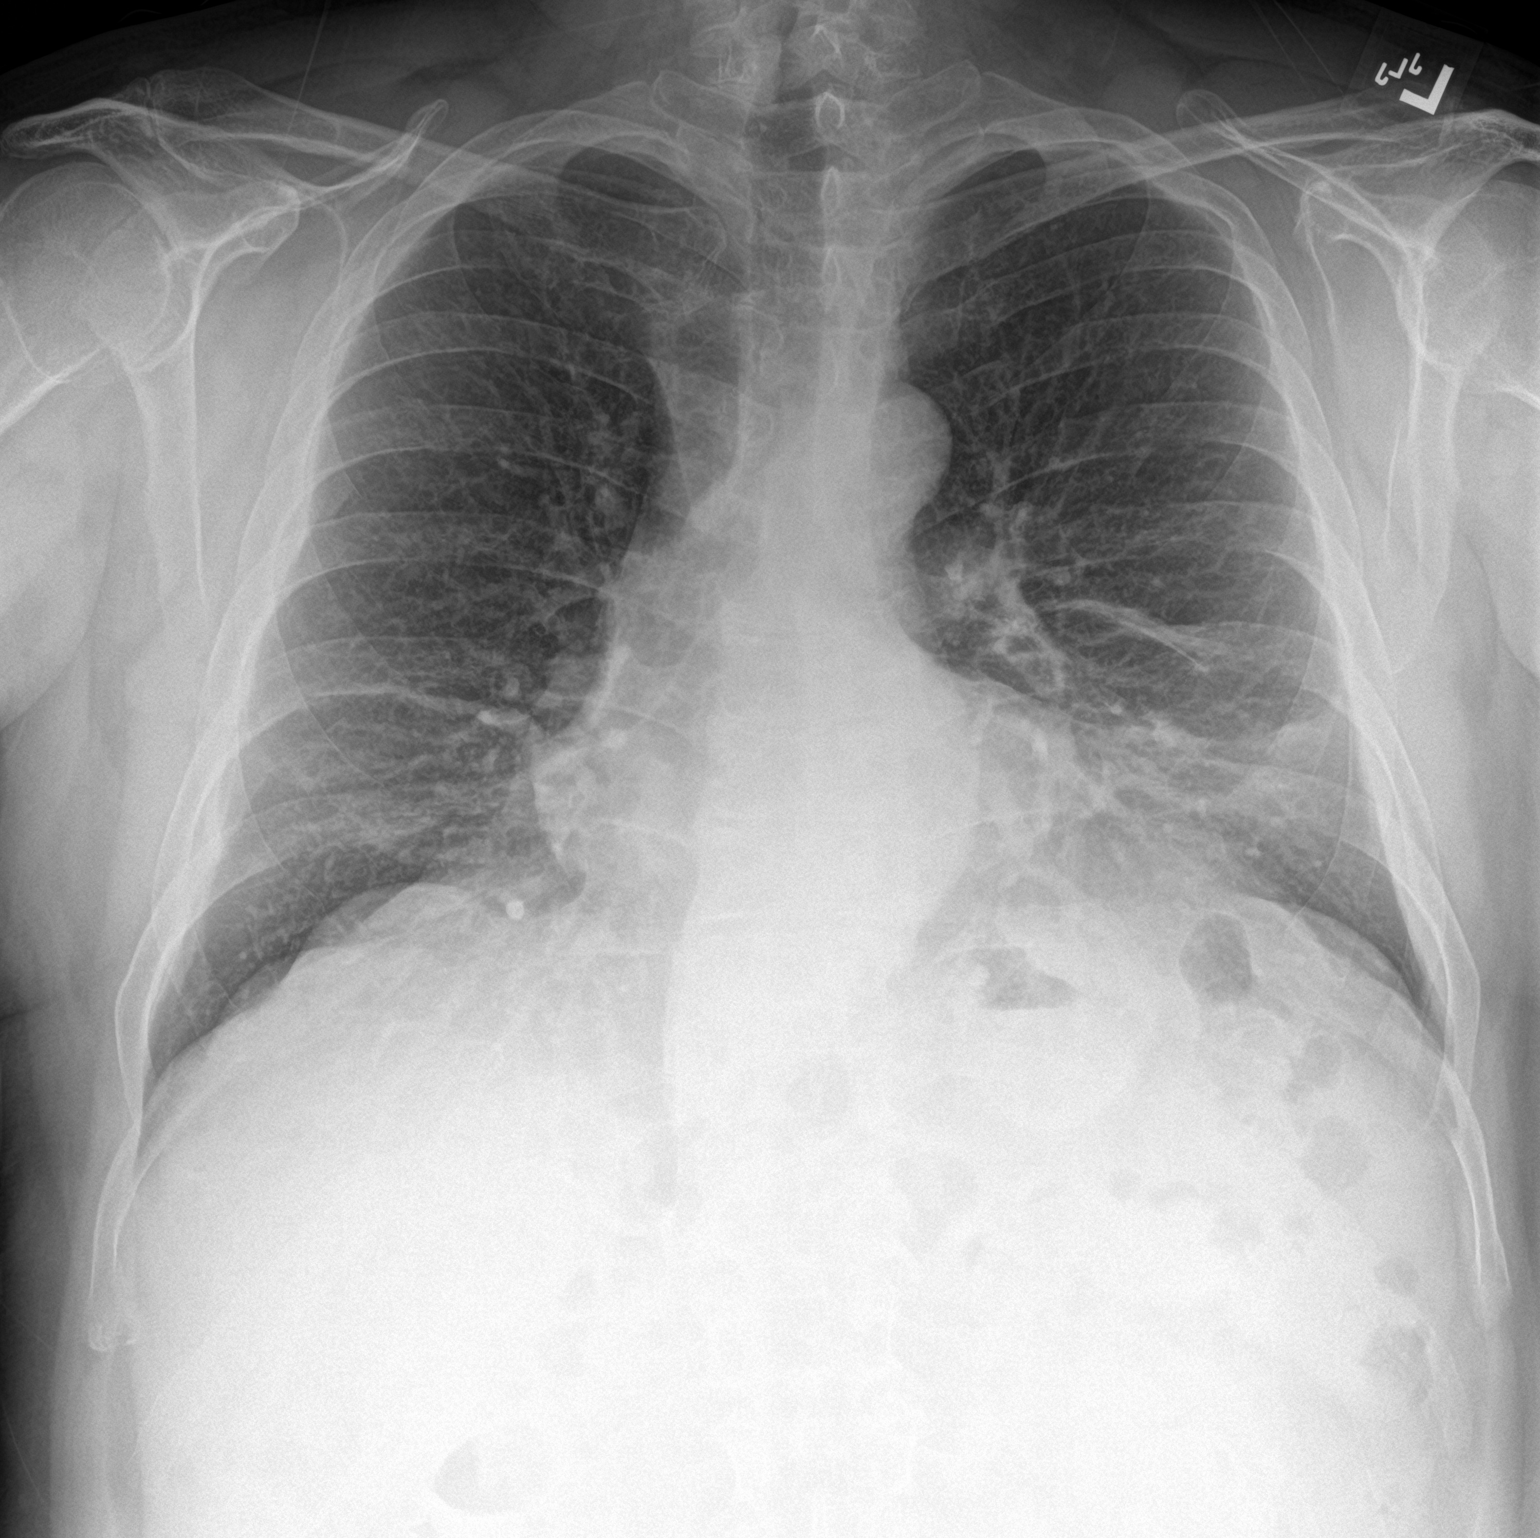

[chest lat]
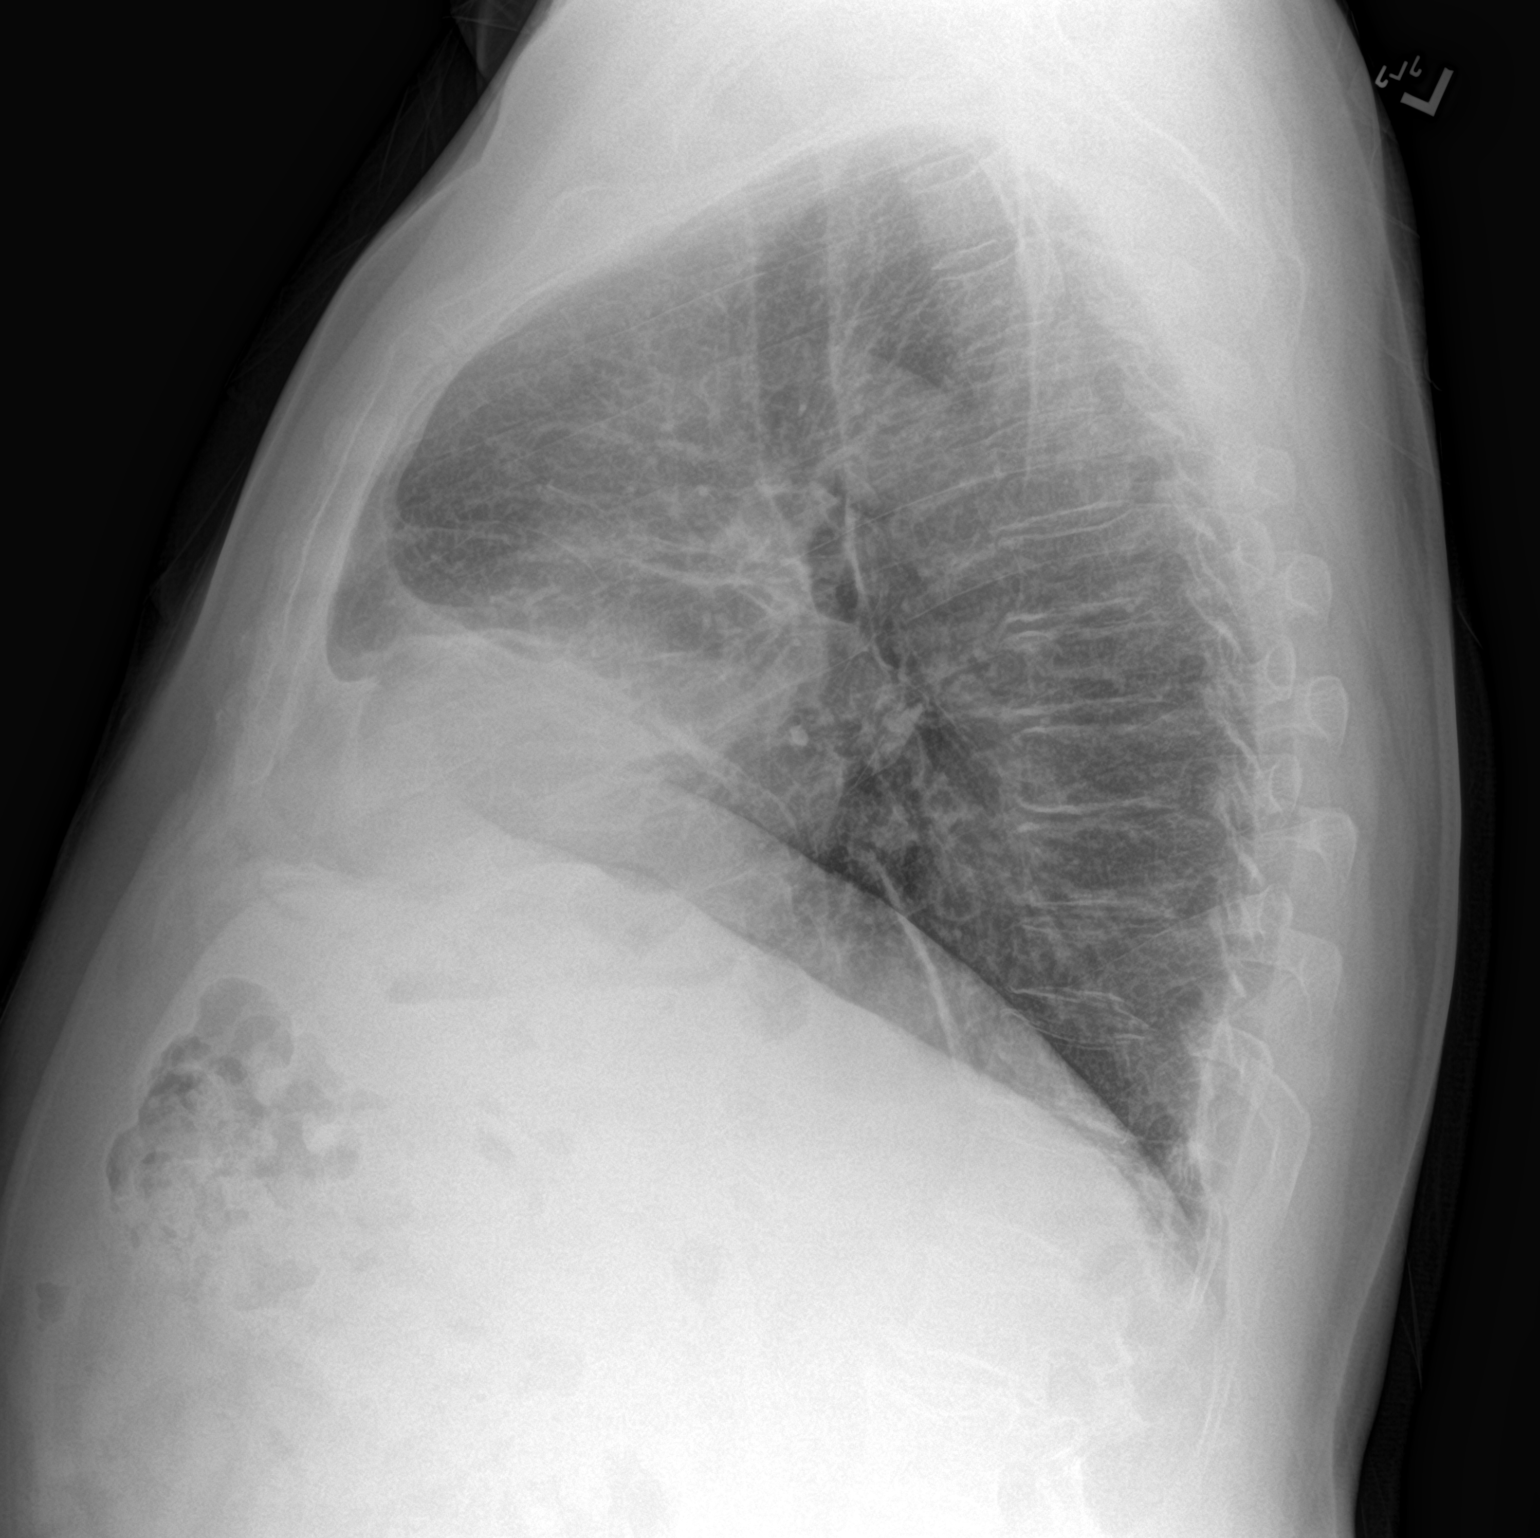

[2 of 2 positions shown; findings below may reference images not displayed]

FINDINGS: Mild linear atelectasis or scar at the right base.

Streaky opacities in the lingula and left base. No effusion.
Borderline heart size. Mildly tortuous aorta. No pneumothorax. Mild
degenerative changes of the spine.
IMPRESSION: 1. Linear scar or atelectasis at the right base
2. Ill-defined streaky linear opacities at the lingula and left base
may reflect pulmonary scarring, although in the absence of priors,
unable to exclude infiltrate.
